# Patient Record
Sex: Female | Born: 1965 | Race: White | Hispanic: No | Marital: Married | Smoking: Never smoker
Health system: Southern US, Community
[De-identification: ages and names within clinical notes are randomized; demographics above are authoritative.]

---

## 1998-11-29 ENCOUNTER — Other Ambulatory Visit: Admission: RE | Admit: 1998-11-29 | Discharge: 1998-11-29 | Payer: Self-pay | Admitting: Obstetrics and Gynecology

## 1999-10-27 ENCOUNTER — Other Ambulatory Visit: Admission: RE | Admit: 1999-10-27 | Discharge: 1999-10-27 | Payer: Self-pay | Admitting: Obstetrics and Gynecology

## 2000-12-09 ENCOUNTER — Other Ambulatory Visit: Admission: RE | Admit: 2000-12-09 | Discharge: 2000-12-09 | Payer: Self-pay | Admitting: Obstetrics and Gynecology

## 2001-09-06 ENCOUNTER — Inpatient Hospital Stay (HOSPITAL_COMMUNITY): Admission: AD | Admit: 2001-09-06 | Discharge: 2001-09-06 | Payer: Self-pay | Admitting: *Deleted

## 2001-09-07 ENCOUNTER — Inpatient Hospital Stay (HOSPITAL_COMMUNITY): Admission: AD | Admit: 2001-09-07 | Discharge: 2001-09-07 | Payer: Self-pay | Admitting: Obstetrics and Gynecology

## 2001-09-11 ENCOUNTER — Inpatient Hospital Stay (HOSPITAL_COMMUNITY): Admission: AD | Admit: 2001-09-11 | Discharge: 2001-09-11 | Payer: Self-pay | Admitting: Obstetrics and Gynecology

## 2001-09-11 ENCOUNTER — Encounter: Payer: Self-pay | Admitting: Obstetrics and Gynecology

## 2001-10-24 ENCOUNTER — Inpatient Hospital Stay (HOSPITAL_COMMUNITY): Admission: AD | Admit: 2001-10-24 | Discharge: 2001-10-26 | Payer: Self-pay | Admitting: Obstetrics and Gynecology

## 2001-10-27 ENCOUNTER — Encounter: Admission: RE | Admit: 2001-10-27 | Discharge: 2001-11-26 | Payer: Self-pay | Admitting: Obstetrics and Gynecology

## 2001-11-27 ENCOUNTER — Encounter: Admission: RE | Admit: 2001-11-27 | Discharge: 2001-12-27 | Payer: Self-pay | Admitting: Obstetrics and Gynecology

## 2001-12-02 ENCOUNTER — Other Ambulatory Visit: Admission: RE | Admit: 2001-12-02 | Discharge: 2001-12-02 | Payer: Self-pay | Admitting: Obstetrics and Gynecology

## 2001-12-28 ENCOUNTER — Encounter: Admission: RE | Admit: 2001-12-28 | Discharge: 2002-01-27 | Payer: Self-pay | Admitting: Obstetrics and Gynecology

## 2002-12-14 ENCOUNTER — Other Ambulatory Visit: Admission: RE | Admit: 2002-12-14 | Discharge: 2002-12-14 | Payer: Self-pay | Admitting: Obstetrics and Gynecology

## 2007-07-04 ENCOUNTER — Inpatient Hospital Stay (HOSPITAL_COMMUNITY): Admission: AD | Admit: 2007-07-04 | Discharge: 2007-07-07 | Payer: Self-pay | Admitting: Obstetrics and Gynecology

## 2010-08-12 NOTE — Discharge Summary (Signed)
NAMEKAELIN, HOLFORD             ACCOUNT NO.:  [id number]   MEDICAL RECORD NO.:  [id number]          PATIENT TYPE:  INP   LOCATION:  9173                          FACILITY:  [facility]   PHYSICIAN:  Lenoard Aden, M.D.DATE OF BIRTH:  April 21, 1965   DATE OF ADMISSION:  07/04/2007  DATE OF DISCHARGE:                               DISCHARGE SUMMARY   INDICATION FOR ADMISSION:  Labor.   HISTORY OF PRESENT ILLNESS:  She is a 45 year old white female, [redacted] weeks  gestation with spontaneous rupture  membranes, contractions, who  presents in active labor.  She has a history of a vaginal delivery x1.   ALLERGIES:  CLARITIN AND AMOUR THYROID.   PAST MEDICAL HISTORY:  Remarkable for hypothyroidism on supplementary  therapy.   SOCIAL HISTORY:  She is a nonsmoker, nondrinker.   MEDICATIONS:  Iron, vitamins and thyroid replacement therapy.   FAMILY HISTORY:  Noncontributory for anemia, MI, hypertension, diabetes  and CVA.   OBSTETRICAL HISTORY:  Spontaneous vaginal delivery of a 7 pound 2 ounce  female in 2003.   PHYSICAL EXAMINATION:  GENERAL:  She is a well-developed, well-nourished  white female in no acute distress.  HEENT:  Normal.  LUNGS:  Clear.  HEART:  Regular rhythm.  ABDOMEN:  Soft, gravid, nontender, estimated fetal weight 7.5 pounds.  PELVIC EXAM:  Cervix is effaced 100%, 0 station.  EXTREMITIES:  No edema.  NEUROLOGICAL:  Nonfocal.  SKIN:  Intact.   IMPRESSION:  Term intrauterine pregnancy in active labor.   PLAN:  Epidural as needed.  Anticipate vaginal delivery.      Lenoard Aden, M.D.  Electronically Signed    RJT/MEDQ  D:  07/05/2007  T:  07/05/2007  Job:  [id number]

## 2010-08-15 NOTE — H&P (Signed)
[facility]  Patient:    LEVA, BAINE Visit Number: [id number] MRN: [id number]          Service Type: OBS Location: [facility] Attending Physician:  Maxie Better Dictated by:   Lenoard Aden, M.D. Admit Date:  09/11/2001 Discharge Date: 09/11/2001                           History and Physical  CHIEF COMPLAINT:              Small for gestational age versus appropriate for gestational age for induction.  HISTORY OF PRESENT ILLNESS:   A 45 year old white female, G1, P0, [facility] October 24, 2001, at 40 weeks with SGR versus AGA for induction.  PAST MEDICAL HISTORY:         Remarkable for hypothyroidism.  History of stroke, alcohol abuse, pernicious anemia and myocardial infarction.  Pregnancy complicated by preterm cervical change and GBS positivity.  MEDICATIONS:                  Prenatal vitamins and Synthroid.  PHYSICAL EXAMINATION:  GENERAL:                      Well-developed, well-nourished white female in no apparent distress.  HEENT:                        Normal.  LUNGS:                        Clear.  HEART:                        Regular rhythm.  ABDOMEN:                      Soft, gravid, nontender.  Estimated fetal weight 7-1/2 pounds.  PELVIC:                       Cervix is 3, 90%, 0 to +1 station.  IMPRESSION:                   1. A 40-week intrauterine pregnancy.                               2. Small for gestational age versus                                  appropriate for gestational age.  PLAN:                         Proceed with induction.  Risks and benefits of induction discussed.  Anticipated attempt at vaginal delivery. Dictated by:   Lenoard Aden, M.D. Attending Physician:  Maxie Better DD:  10/24/01 TD:  10/24/01 Job: [id number]

## 2010-12-23 LAB — CBC
HCT: 33.8 — ABNORMAL LOW
HCT: 36.9
Hemoglobin: 11.6 — ABNORMAL LOW
Hemoglobin: 12.7
MCHC: 34.3
MCHC: 34.5
MCV: 89.5
MCV: 91.2
Platelets: 199
Platelets: 207
RBC: 3.7 — ABNORMAL LOW
RBC: 4.12
RDW: 13.9
RDW: 14.3
WBC: 10.4
WBC: 9

## 2010-12-23 LAB — RPR: RPR Ser Ql: NONREACTIVE

## 2012-08-02 ENCOUNTER — Ambulatory Visit (INDEPENDENT_AMBULATORY_CARE_PROVIDER_SITE_OTHER): Payer: PRIVATE HEALTH INSURANCE | Admitting: General Surgery

## 2012-08-11 ENCOUNTER — Ambulatory Visit (INDEPENDENT_AMBULATORY_CARE_PROVIDER_SITE_OTHER): Payer: PRIVATE HEALTH INSURANCE | Admitting: General Surgery

## 2012-08-11 ENCOUNTER — Encounter (INDEPENDENT_AMBULATORY_CARE_PROVIDER_SITE_OTHER): Payer: Self-pay | Admitting: Surgery

## 2012-08-11 ENCOUNTER — Ambulatory Visit (INDEPENDENT_AMBULATORY_CARE_PROVIDER_SITE_OTHER): Payer: PRIVATE HEALTH INSURANCE | Admitting: Surgery

## 2012-08-11 VITALS — BP 104/78 | HR 71 | Temp 97.1°F | Resp 14 | Ht 63.0 in | Wt 126.6 lb

## 2012-08-11 DIAGNOSIS — N61 Mastitis without abscess: Secondary | ICD-10-CM

## 2012-08-11 NOTE — Progress Notes (Signed)
NAME: Hannah Rich DOB: 01-13-1966 MRN: [id number]                                                                                      DATE: 08/11/2012  PCP: Angelena Sole, MD Referring Provider: Lenoard Aden, MD  IMPRESSION:  Likely resolved mastitis  PLAN:   she is already scheduled for a followup sonogram of the right breast on the right breast is Will make further plans. I suspect, if anything suspicious is found, a needle core biopsy will be the next best choice.                 CC:  Chief Complaint  Patient presents with  . Breast Problem    eval Rt br mastitis    HPI:  Hannah Rich is a 47 y.o.  female who presents for evaluation of possible right breast mastitis. She developed a heavy sensation in her breasts such as she had when she rested many years ago. She has a history of having a mastitis many years ago as well.she hasn't noticed any redness, although the breasts have been a little bit tender. She's been treated with a course of antibiotics and subjectively improved and really is having no breast symptoms whatsoever currently. She has no other past history of breast problems and no family history of breast cancer.  PMH:  has no past medical history on file.  PSH:   has no past surgical history on file.  ALLERGIES:  No Known Allergies  MEDICATIONS: Current outpatient prescriptions:calcium carbonate 200 MG capsule, Take 250 mg by mouth 2 (two) times daily with a meal., Disp: , Rfl: ;  Evening Primrose topical oil, Apply topically as needed for dry skin., Disp: , Rfl: ;  fish oil-omega-3 fatty acids 1000 MG capsule, Take 2 g by mouth daily., Disp: , Rfl: ;  magnesium 30 MG tablet, Take 30 mg by mouth 2 (two) times daily., Disp: , Rfl:  Probiotic Product (PROBIOTIC DAILY PO), Take by mouth daily., Disp: , Rfl: ;  SYNTHROID 100 MCG tablet, , Disp: , Rfl:   ROS: She has filled out our 12 point review of systems and it is negative . EXAM:   Vital signs:BP  104/78  Pulse 71  Temp(Src) 97.1 F (36.2 C) (Temporal)  Resp 14  Ht 5\' 3"  (1.6 m)  Wt 126 lb 9.6 oz (57.425 kg)  BMI 22.43 kg/m2  SpO2 99% Gen.: Patient alert oriented healthy-appearing, NAD Breasts: Symmetric, nodular consistent with mild fibrocystic changes. No dominant mass. Nontender. Skin nipple and areolar areas are normal. No erythema. Lymphatics: No axillary or supraclavicular adenopathy noted  DATA REVIEWED:  Count report is available and shows a somewhat hypoechoic mass with through-transmission and edematous adjacent changes as well as a dilated duct. These appear to be consistent with a mild mastitis.    Ardene Remley J 08/11/2012  CC: Lenoard Aden, MD, Angelena Sole, MD

## 2012-08-11 NOTE — Patient Instructions (Signed)
After you have the ultrasound you were scheduled for later this month, if there is anything suspicious will be happy to see you back again.

## 2012-08-19 ENCOUNTER — Ambulatory Visit: Payer: Self-pay | Admitting: Sports Medicine

## 2012-08-23 ENCOUNTER — Encounter (INDEPENDENT_AMBULATORY_CARE_PROVIDER_SITE_OTHER): Payer: Self-pay

## 2012-08-24 ENCOUNTER — Ambulatory Visit (INDEPENDENT_AMBULATORY_CARE_PROVIDER_SITE_OTHER): Payer: PRIVATE HEALTH INSURANCE | Admitting: Sports Medicine

## 2012-08-24 VITALS — BP 107/74 | HR 67 | Wt 126.0 lb

## 2012-08-24 DIAGNOSIS — M722 Plantar fascial fibromatosis: Secondary | ICD-10-CM

## 2012-08-24 MED ORDER — IBUPROFEN 800 MG PO TABS: 800.0000 mg | ORAL_TABLET | Freq: Three times a day (TID) | ORAL | Status: AC | PRN

## 2012-08-24 NOTE — Progress Notes (Signed)
   Subjective:    I'm seeing this patient as a consultation for:  Dr. Angelena Sole  CC: Foot pain  HPI: This is a very pleasant 47 year old female who comes in with pain she localizes at the calcaneal insertion of her right plantar fascia for approximately 6 months. Pain is worse with the first few steps in the morning and then gets better through the day. Localized, does radiate, moderate.  Past medical history, Surgical history, Family history not pertinant except as noted below, Social history, Allergies, and medications have been entered into the medical record, reviewed, and no changes needed.   Review of Systems: No headache, visual changes, nausea, vomiting, diarrhea, constipation, dizziness, abdominal pain, skin rash, fevers, chills, night sweats, weight loss, swollen lymph nodes, body aches, joint swelling, muscle aches, chest pain, shortness of breath, mood changes, visual or auditory hallucinations.   Objective:   General: Well Developed, well nourished, and in no acute distress.  Neuro/Psych: Alert and oriented x3, extra-ocular muscles intact, able to move all 4 extremities, sensation grossly intact. Skin: Warm and dry, no rashes noted.  Respiratory: Not using accessory muscles, speaking in full sentences, trachea midline.  Cardiovascular: Pulses palpable, no extremity edema. Abdomen: Does not appear distended. Right Foot: No visible erythema or swelling. Range of motion is full in all directions. Strength is 5/5 in all directions. No hallux valgus. Pes cavus present. No abnormal callus noted. No pain over the navicular prominence, or base of fifth metatarsal. Mild tenderness to palpation of the calcaneal insertion of plantar fascia. No pain at the Achilles insertion. No pain over the calcaneal bursa. No pain of the retrocalcaneal bursa. No tenderness to palpation over the tarsals, metatarsals, or phalanges. No hallux rigidus or limitus. No tenderness palpation over  interphalangeal joints. No pain with compression of the metatarsal heads. Neurovascularly intact distally.  Impression and Recommendations:   This case required medical decision making of moderate complexity.

## 2012-08-24 NOTE — Assessment & Plan Note (Signed)
With bilateral pes cavus. Exercises, ibuprofen 800 mg 3 times a day, hip abductor rehabilitation. She will come back for custom orthotics.

## 2012-08-24 NOTE — Patient Instructions (Addendum)
Hip Rehabilitation Protocol:  1.  Side leg raises.  3x30 with no weight, then 3x15 with 2 lb ankle weight, then 3x15 with 5 lb ankle weight 2.  Standing hip rotation.  3x30 with no weight, then 3x15 with 2 lb ankle weight, then 3x15 with 5 lb ankle weight. 3.  Side step ups.  3x30 with no weight, then 3x15 with 5 lbs in backpack, then 3x15 with 10 lbs in backpack.  Foot exercises, ibuprofen 800 mg 3 times a day for 2 weeks. Return at my next open slot for custom orthotics.

## 2012-08-30 ENCOUNTER — Ambulatory Visit (INDEPENDENT_AMBULATORY_CARE_PROVIDER_SITE_OTHER): Payer: PRIVATE HEALTH INSURANCE | Admitting: Sports Medicine

## 2012-08-30 ENCOUNTER — Encounter: Payer: Self-pay | Admitting: Sports Medicine

## 2012-08-30 VITALS — BP 106/64 | HR 69

## 2012-08-30 DIAGNOSIS — M722 Plantar fascial fibromatosis: Secondary | ICD-10-CM

## 2012-08-30 NOTE — Assessment & Plan Note (Signed)
80% improved already with home exercises. Custom orthotics as above. Return in 4 weeks. Injection if no better.

## 2012-08-30 NOTE — Progress Notes (Signed)
    Patient was fitted for a : standard, cushioned, semi-rigid orthotic. The orthotic was heated and afterward the patient stood on the orthotic blank positioned on the orthotic stand. The patient was positioned in subtalar neutral position and 10 degrees of ankle dorsiflexion in a weight bearing stance. After completion of molding, a stable base was applied to the orthotic blank. The blank was ground to a stable position for weight bearing. Size: 8 Base: Blue EVA Additional Posting and Padding: None The patient ambulated these, and they were very comfortable.  I spent 40 minutes with this patient, greater than 50% was face-to-face time counseling regarding the below diagnosis.   

## 2012-09-27 ENCOUNTER — Ambulatory Visit (INDEPENDENT_AMBULATORY_CARE_PROVIDER_SITE_OTHER): Payer: PRIVATE HEALTH INSURANCE | Admitting: Sports Medicine

## 2012-09-27 DIAGNOSIS — M545 Low back pain, unspecified: Secondary | ICD-10-CM

## 2012-09-27 DIAGNOSIS — M722 Plantar fascial fibromatosis: Secondary | ICD-10-CM

## 2012-09-27 NOTE — Progress Notes (Signed)
  Subjective:    CC: Followup  HPI: Plantar fasciitis, right: resolved with conservative measures and custom orthotics, home exercises, anti-inflammatories. She has a working extensively on the hip abductor rehabilitation.  Low back pain: Present on both sides, right worse than left at the sacroiliac joint, no trauma, no radicular symptoms, not worse with sitting, it is worse with standing for long periods of time. Pain is moderate, persistent, stable, localized.  Past medical history, Surgical history, Family history not pertinant except as noted below, Social history, Allergies, and medications have been entered into the medical record, reviewed, and no changes needed.   Review of Systems: No fevers, chills, night sweats, weight loss, chest pain, or shortness of breath.   Objective:    General: Well Developed, well nourished, and in no acute distress.  Neuro: Alert and oriented x3, extra-ocular muscles intact, sensation grossly intact.  HEENT: Normocephalic, atraumatic, pupils equal round reactive to light, neck supple, no masses, no lymphadenopathy, thyroid nonpalpable.  Skin: Warm and dry, no rashes. Cardiac: Regular rate and rhythm, no murmurs rubs or gallops, no lower extremity edema.  Respiratory: Clear to auscultation bilaterally. Not using accessory muscles, speaking in full sentences. Back Exam:  Inspection: Unremarkable  Motion: Flexion 45 deg, Extension 45 deg, Side Bending to 45 deg bilaterally,  Rotation to 45 deg bilaterally  SLR laying: Negative  XSLR laying: Negative  Palpable tenderness: Very minimal at the posterior superior iliac spine. FABER: negative. Sensory change: Gross sensation intact to all lumbar and sacral dermatomes.  Reflexes: 2+ at both patellar tendons, 2+ at achilles tendons, Babinski's downgoing.  Strength at foot  Plantar-flexion: 5/5 Dorsi-flexion: 5/5 Eversion: 5/5 Inversion: 5/5  Leg strength  Quad: 5/5 Hamstring: 5/5 Hip flexor: 5/5 Hip  abductors: 5/5  Gait unremarkable. Hip abductor strength is excellent.  Right Foot: No visible erythema or swelling. Range of motion is full in all directions. Strength is 5/5 in all directions. No hallux valgus. No pes cavus or pes planus. No abnormal callus noted. No pain over the navicular prominence, or base of fifth metatarsal. No tenderness to palpation of the calcaneal insertion of plantar fascia. No pain at the Achilles insertion. No pain over the calcaneal bursa. No pain of the retrocalcaneal bursa. No tenderness to palpation over the tarsals, metatarsals, or phalanges. No hallux rigidus or limitus. No tenderness palpation over interphalangeal joints. No pain with compression of the metatarsal heads. Neurovascularly intact distally.  Impression and Recommendations:

## 2012-09-27 NOTE — Assessment & Plan Note (Signed)
Greatly improved with home exercises and custom orthotics. No pain today. She'll return in one month, if continues to have pain we can consider injection.

## 2012-09-27 NOTE — Assessment & Plan Note (Signed)
Seems to be sacroiliac. Conservative measures initiated, with SI joint exercises. She'll return in one month if no better we can consider an x-ray.

## 2012-10-31 ENCOUNTER — Ambulatory Visit: Payer: PRIVATE HEALTH INSURANCE | Admitting: Sports Medicine

## 2012-10-31 DIAGNOSIS — Z0289 Encounter for other administrative examinations: Secondary | ICD-10-CM

## 2015-11-11 ENCOUNTER — Encounter: Payer: Self-pay | Admitting: Sports Medicine

## 2015-11-11 ENCOUNTER — Ambulatory Visit (INDEPENDENT_AMBULATORY_CARE_PROVIDER_SITE_OTHER): Payer: PRIVATE HEALTH INSURANCE | Admitting: Sports Medicine

## 2015-11-11 DIAGNOSIS — M7661 Achilles tendinitis, right leg: Secondary | ICD-10-CM

## 2015-11-11 MED ORDER — NITROGLYCERIN 0.2 MG/HR TD PT24: MEDICATED_PATCH | TRANSDERMAL | 11 refills | Status: AC

## 2015-11-11 NOTE — Progress Notes (Signed)
   Subjective:    I'm seeing this patient as a consultation for:  Dr. Angelena SoleWeston Saunders  CC: right Achilles pain  HPI: This is a pleasant 50 year old female, she was running at the beach, subsequently developed pain and swelling at the mid Achilles. Moderate, persistent without radiation.  She does have a triathlon coming up, understands that she can do the biking and swimming but not the running.  Past medical history, Surgical history, Family history not pertinant except as noted below, Social history, Allergies, and medications have been entered into the medical record, reviewed, and no changes needed.   Review of Systems: No headache, visual changes, nausea, vomiting, diarrhea, constipation, dizziness, abdominal pain, skin rash, fevers, chills, night sweats, weight loss, swollen lymph nodes, body aches, joint swelling, muscle aches, chest pain, shortness of breath, mood changes, visual or auditory hallucinations.   Objective:   General: Well Developed, well nourished, and in no acute distress.  Neuro/Psych: Alert and oriented x3, extra-ocular muscles intact, able to move all 4 extremities, sensation grossly intact. Skin: Warm and dry, no rashes noted.  Respiratory: Not using accessory muscles, speaking in full sentences, trachea midline.  Cardiovascular: Pulses palpable, no extremity edema. Abdomen: Does not appear distended. Right Ankle: No visible erythema or swelling. Range of motion is full in all directions. Strength is 5/5 in all directions. Stable lateral and medial ligaments; squeeze test and kleiger test unremarkable; Talar dome nontender; No pain at base of 5th MT; No tenderness over cuboid; No tenderness over N spot or navicular prominence No tenderness on posterior aspects of lateral and medial malleolus No sign of peroneal tendon subluxations; Negative tarsal tunnel tinel's Visibly swollen mid Achilles.  Impression and Recommendations:   This case required medical  decision making of moderate complexity.  Right Achilles tendinitis Bilateral heel lifts in her orthotics, topical nitroglycerin, eccentric rehabilitation exercises. Return to see me in one month. She does have a triathlon coming up but will avoid the running component.

## 2015-11-11 NOTE — Patient Instructions (Signed)
Begin with easy walking, heel, toe and backwards  Do calf raises on a step:  First lower and then raise on 1 foot  If this is painful, lower on 1 foot, do the heel raise on both feet Begin with 3 sets of 10 repetitions  Increase by 5 repetitions every 3 days  Goal is 3 sets of 30 repetitions  Do with both straight knee and knee at 20 degrees of flexion  If pain persists, once you can do 3 sets of 30 without weight, add backpack with 5 lbs.  Increase by 5 lbs per week to max of 30 lbs for 3 sets of 15   

## 2015-11-11 NOTE — Assessment & Plan Note (Signed)
Bilateral heel lifts in her orthotics, topical nitroglycerin, eccentric rehabilitation exercises. Return to see me in one month. She does have a triathlon coming up but will avoid the running component.

## 2017-12-17 ENCOUNTER — Encounter: Payer: Self-pay | Admitting: Sports Medicine

## 2017-12-17 ENCOUNTER — Ambulatory Visit (INDEPENDENT_AMBULATORY_CARE_PROVIDER_SITE_OTHER): Payer: PRIVATE HEALTH INSURANCE

## 2017-12-17 ENCOUNTER — Ambulatory Visit (INDEPENDENT_AMBULATORY_CARE_PROVIDER_SITE_OTHER): Payer: PRIVATE HEALTH INSURANCE | Admitting: Sports Medicine

## 2017-12-17 DIAGNOSIS — M170 Bilateral primary osteoarthritis of knee: Secondary | ICD-10-CM | POA: Diagnosis not present

## 2017-12-17 DIAGNOSIS — G8929 Other chronic pain: Secondary | ICD-10-CM | POA: Diagnosis not present

## 2017-12-17 DIAGNOSIS — M25561 Pain in right knee: Secondary | ICD-10-CM | POA: Diagnosis not present

## 2017-12-17 DIAGNOSIS — M25562 Pain in left knee: Secondary | ICD-10-CM | POA: Diagnosis not present

## 2017-12-17 MED ORDER — CELECOXIB 200 MG PO CAPS: ORAL_CAPSULE | ORAL | 2 refills | Status: AC

## 2017-12-17 NOTE — Assessment & Plan Note (Signed)
Early knee osteoarthritis, Celebrex, rehab exercises, x-rays. Avoid deep knee flexion and twisting.   Excellent return to see me in 4 to 6 weeks, injections if no better.

## 2017-12-17 NOTE — Progress Notes (Signed)
Subjective:    I'm seeing this patient as a consultation for: Dr. Angelena SoleWeston Saunders  CC: Knee pain  HPI: This is a pleasant 52 year old female, she is currently working on her masters degree and has been spending a lot of time sedentary.  She has started to notice pain on the medial aspect of both knees, worse with the first few steps after sitting for a period of time.  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  Symptoms are moderate, persistent.  No radiation, no trauma, no mechanical symptoms.  I reviewed the past medical history, family history, social history, surgical history, and allergies today and no changes were needed.  Please see the problem list section below in epic for further details.  Past Medical History: No past medical history on file. Past Surgical History: No past surgical history on file. Social History: Social History   Socioeconomic History  . Marital status: Married    Spouse name: Not on file  . Number of children: Not on file  . Years of education: Not on file  . Highest education level: Not on file  Occupational History  . Not on file  Social Needs  . Financial resource strain: Not on file  . Food insecurity:    Worry: Not on file    Inability: Not on file  . Transportation needs:    Medical: Not on file    Non-medical: Not on file  Tobacco Use  . Smoking status: Never Smoker  . Smokeless tobacco: Never Used  Substance and Sexual Activity  . Alcohol use: No  . Drug use: No  . Sexual activity: Not on file  Lifestyle  . Physical activity:    Days per week: Not on file    Minutes per session: Not on file  . Stress: Not on file  Relationships  . Social connections:    Talks on phone: Not on file    Gets together: Not on file    Attends religious service: Not on file    Active member of club or organization: Not on file    Attends  meetings of clubs or organizations: Not on file    Relationship status: Not on file  Other Topics Concern  . Not on file  Social History Narrative  . Not on file   Family History: Family History  Problem Relation Age of Onset  . Stroke Father   . Thyroid disease Brother    Allergies: No Known Allergies Medications: See med rec.  Review of Systems: No headache, visual changes, nausea, vomiting, diarrhea, constipation, dizziness, abdominal pain, skin rash, fevers, chills, night sweats, weight loss, swollen lymph nodes, body aches, joint swelling, muscle aches, chest pain, shortness of breath, mood changes, visual or auditory hallucinations.   Objective:   General: Well Developed, well nourished, and in no acute distress.  Neuro:  Extra-ocular muscles intact, able to move all 4 extremities, sensation grossly intact.  Deep tendon reflexes tested were normal. Psych: Alert and oriented, mood congruent with affect. ENT:  Ears and nose appear unremarkable.  Hearing grossly normal. Neck: Unremarkable overall appearance, trachea midline.  No visible thyroid enlargement. Eyes: Conjunctivae and lids appear unremarkable.  Pupils equal and round. Skin: Warm and dry, no rashes noted.  Cardiovascular: Pulses palpable, no extremity edema. Bilateral knees: Minimal fullness, moderate tenderness to palpation at the medial joint line ROM normal  in flexion and extension and lower leg rotation. Ligaments with solid consistent endpoints including ACL, PCL, LCL, MCL. Negative Mcmurray's and provocative meniscal tests. Non painful patellar compression. Patellar and quadriceps tendons unremarkable. Hamstring and quadriceps strength is normal.  I did personally review the x-rays, there is mild joint space loss in the medial compartment both knees with subchondral sclerosis, and spurring of the tibial spines.  This is all consistent with mild osteoarthritis.  Impression and Recommendations:   This case  required medical decision making of moderate complexity.  Primary osteoarthritis of both knees Early knee osteoarthritis, Celebrex, rehab exercises, x-rays. Avoid deep knee flexion and twisting.   Excellent return to see me in 4 to 6 weeks, injections if no better. ___________________________________________ Ihor Austin. Benjamin Stain, M.D., ABFM., CAQSM. Primary Care and Sports Medicine [facility]  Adjunct Instructor of Family Medicine  [employer]

## 2019-01-28 IMAGING — DX DG KNEE COMPLETE 4+V*R*
4 series · 4 of 4 positions shown · non-contrast
Comparison: None.

CLINICAL DATA: Medial bilateral knee pain [REDACTED]

EXAM:
RIGHT KNEE - COMPLETE 4+ VIEW

[tunnel]
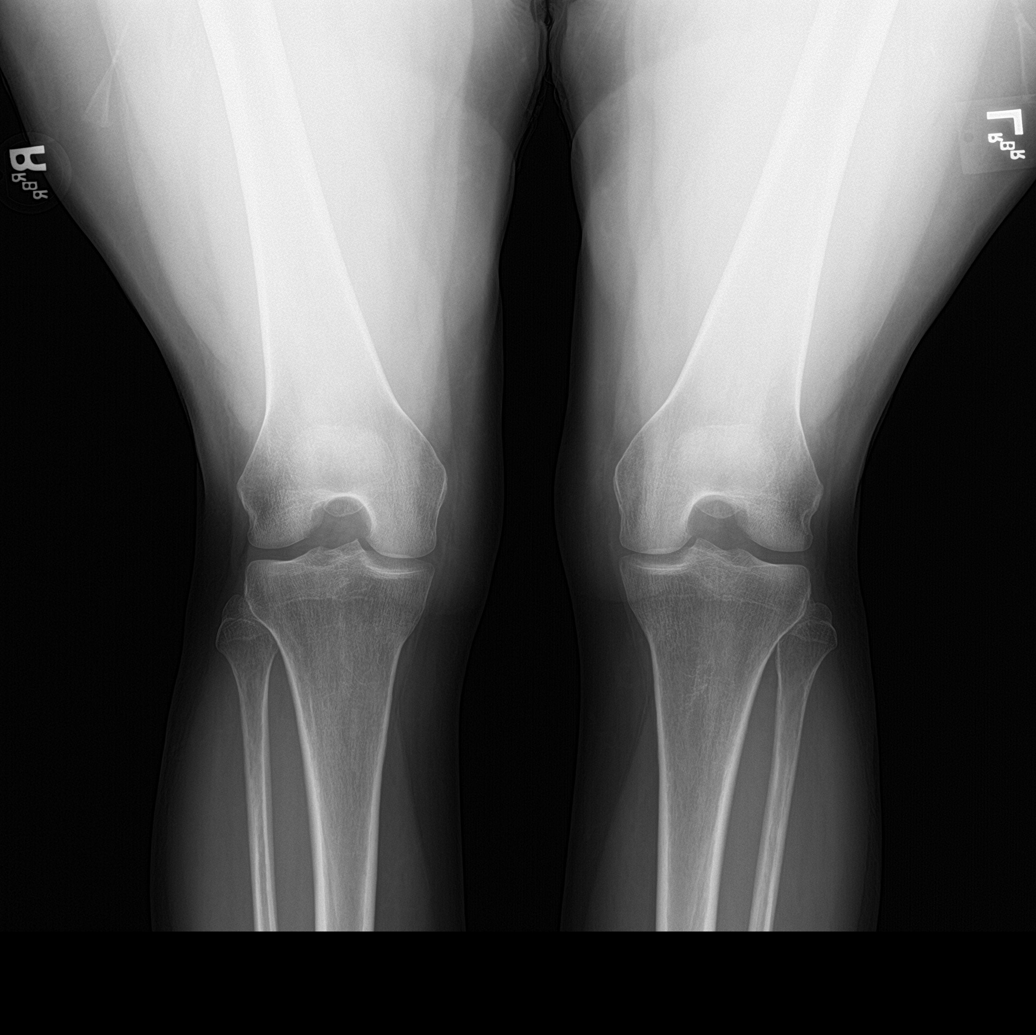

[knee lat]
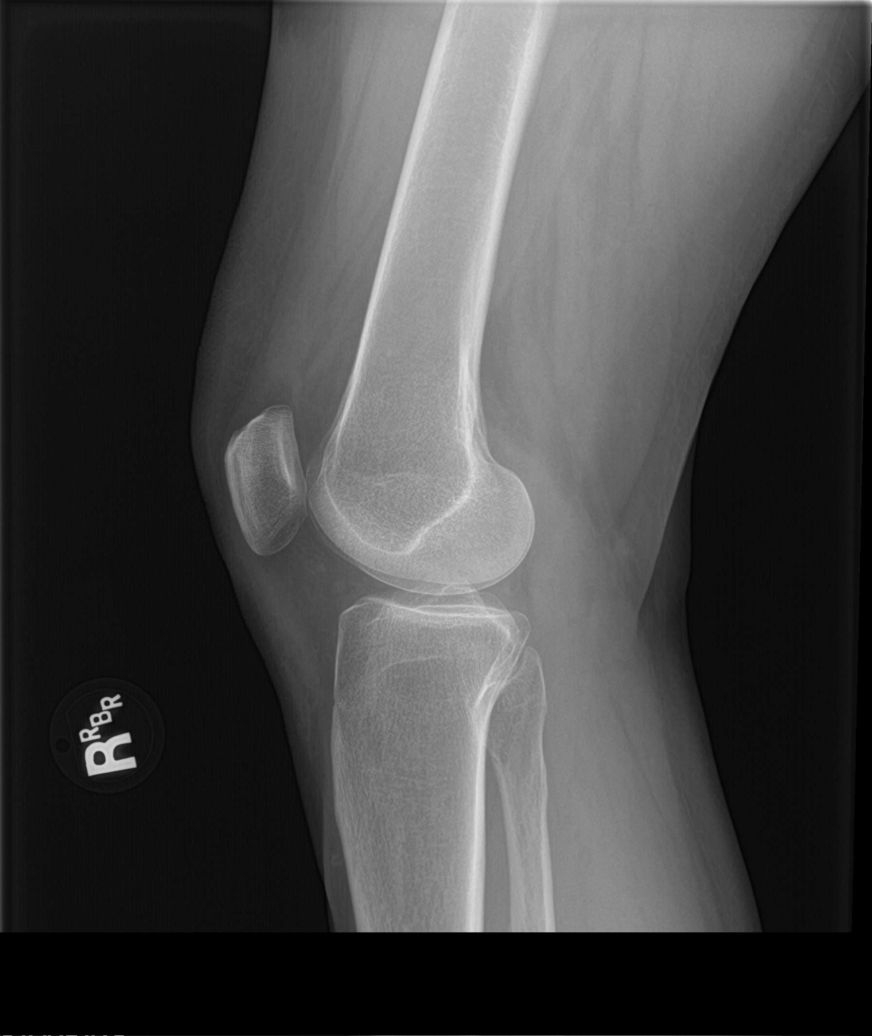

[knee sunrise]
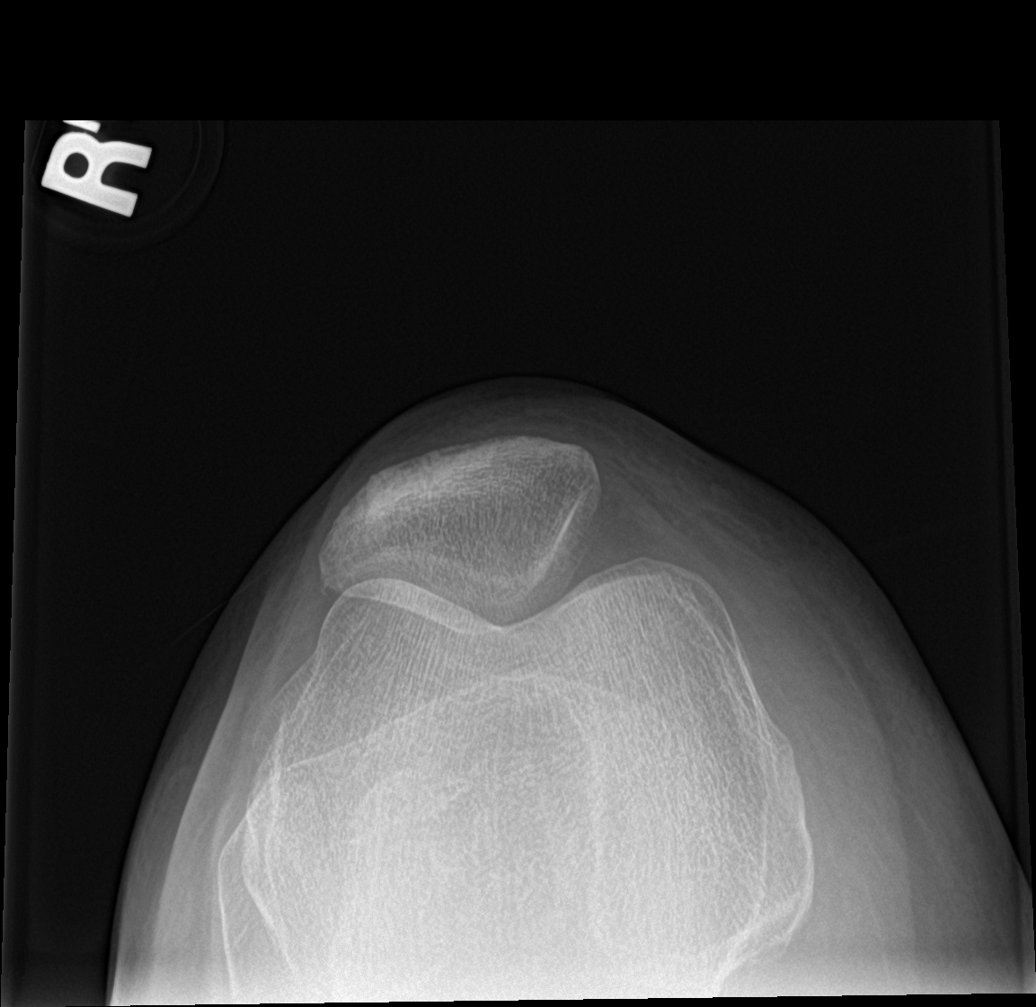

[knee ap bilat standing]
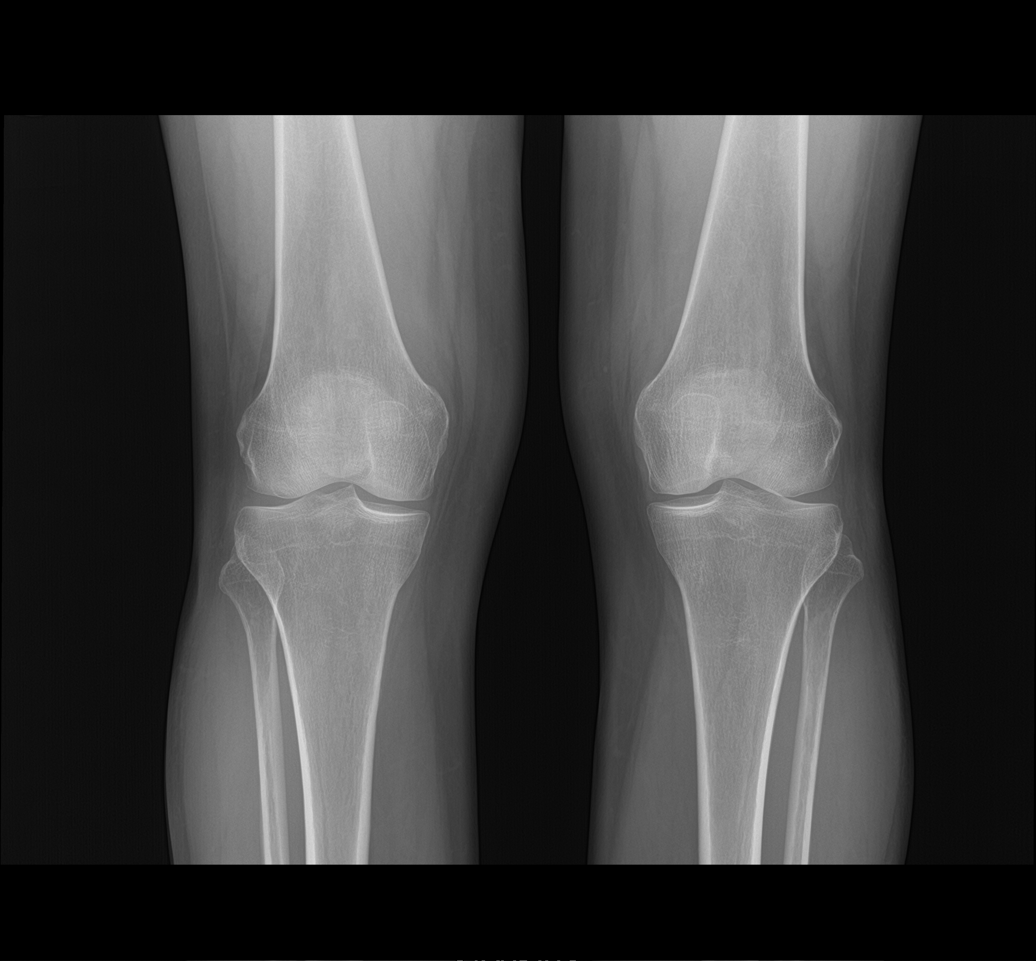

[4 of 4 positions shown; findings below may reference images not displayed]

FINDINGS: No fracture or dislocation is seen.

Tricompartmental joint spaces are preserved.

The visualized soft tissues are unremarkable.

No definite suprapatellar knee joint effusion.
IMPRESSION: Negative.
